# Patient Record
Sex: Female | Born: 1965 | Race: White | Hispanic: Yes | Marital: Married
Health system: Southern US, Community
[De-identification: ages and names within clinical notes are randomized; demographics above are authoritative.]

---

## 2006-09-30 ENCOUNTER — Inpatient Hospital Stay (HOSPITAL_COMMUNITY): Admission: AD | Admit: 2006-09-30 | Discharge: 2006-10-02 | Payer: Self-pay | Admitting: Obstetrics and Gynecology

## 2010-09-22 ENCOUNTER — Other Ambulatory Visit (HOSPITAL_COMMUNITY): Payer: Self-pay | Admitting: *Deleted

## 2010-09-22 DIAGNOSIS — Z139 Encounter for screening, unspecified: Secondary | ICD-10-CM

## 2010-10-03 ENCOUNTER — Ambulatory Visit (HOSPITAL_COMMUNITY)
Admission: RE | Admit: 2010-10-03 | Discharge: 2010-10-03 | Disposition: A | Discharge status: Home / Self Care | Payer: Self-pay | Source: Ambulatory Visit | Attending: Family Medicine | Admitting: Family Medicine

## 2010-10-03 DIAGNOSIS — Z1231 Encounter for screening mammogram for malignant neoplasm of breast: Secondary | ICD-10-CM | POA: Insufficient documentation

## 2010-10-03 DIAGNOSIS — Z139 Encounter for screening, unspecified: Secondary | ICD-10-CM

## 2010-10-07 ENCOUNTER — Other Ambulatory Visit: Payer: Self-pay | Admitting: *Deleted

## 2010-10-07 DIAGNOSIS — R928 Other abnormal and inconclusive findings on diagnostic imaging of breast: Secondary | ICD-10-CM

## 2010-10-19 ENCOUNTER — Other Ambulatory Visit: Payer: Self-pay | Admitting: *Deleted

## 2010-10-19 ENCOUNTER — Ambulatory Visit (HOSPITAL_COMMUNITY)
Admission: RE | Admit: 2010-10-19 | Discharge: 2010-10-19 | Disposition: A | Discharge status: Home / Self Care | Payer: Self-pay | Source: Ambulatory Visit | Attending: Family Medicine | Admitting: Family Medicine

## 2010-10-19 ENCOUNTER — Ambulatory Visit (HOSPITAL_COMMUNITY)
Admission: RE | Admit: 2010-10-19 | Discharge: 2010-10-19 | Disposition: A | Discharge status: Home / Self Care | Payer: Self-pay | Source: Ambulatory Visit | Attending: *Deleted | Admitting: *Deleted

## 2010-10-19 DIAGNOSIS — R928 Other abnormal and inconclusive findings on diagnostic imaging of breast: Secondary | ICD-10-CM

## 2010-10-19 DIAGNOSIS — N6009 Solitary cyst of unspecified breast: Secondary | ICD-10-CM | POA: Insufficient documentation

## 2012-07-25 IMAGING — US US BREAST*R*
1 series · 9 of 9 positions shown · non-contrast
Comparison: 10/03/2010 baseline screening mammogram.

CLINICAL DATA: Recall from screening mammography.

DIGITAL DIAGNOSTIC RIGHT BREAST MAMMOGRAM  AND RIGHT BREAST
ULTRASOUND:

[Series 1: us breast*right* · 0.08mm/px · 9 of 9 slices shown]
[im 1/9]
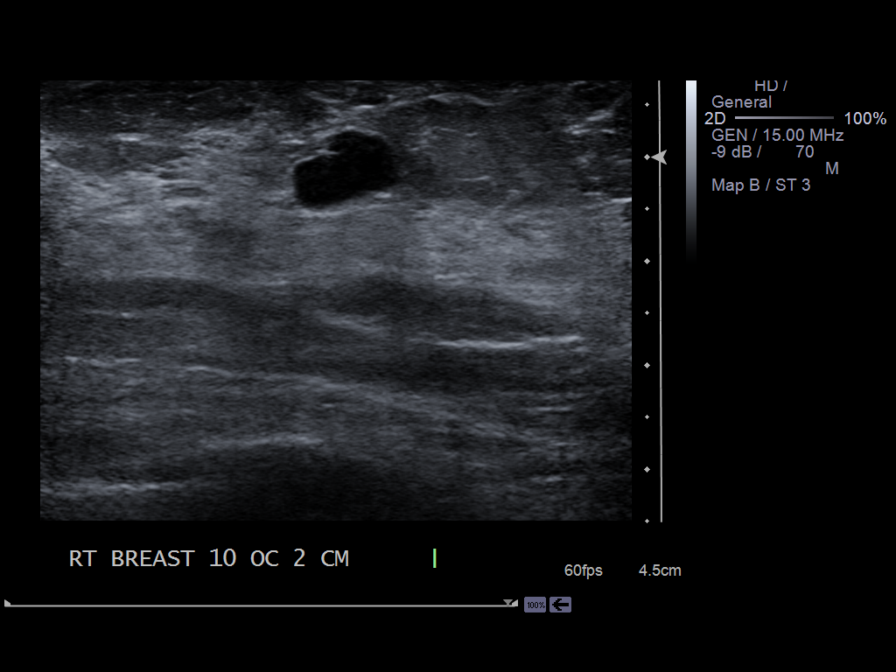
[im 2/9]
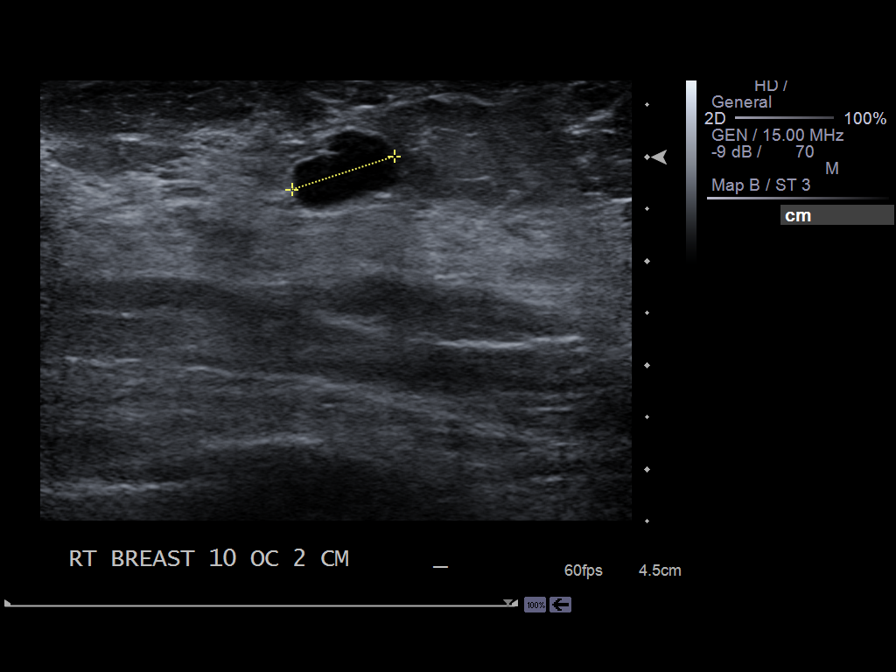
[im 3/9]
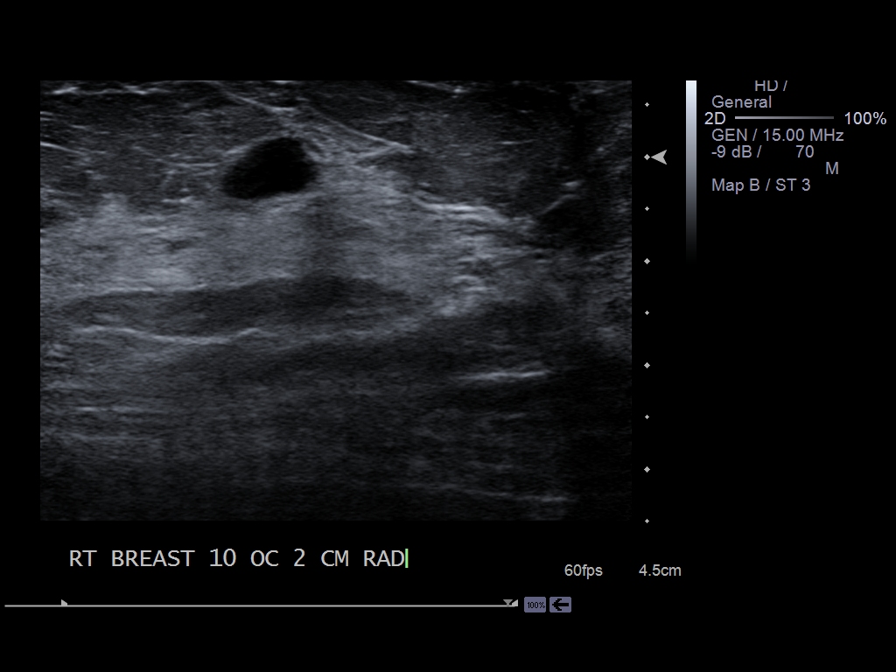
[im 4/9]
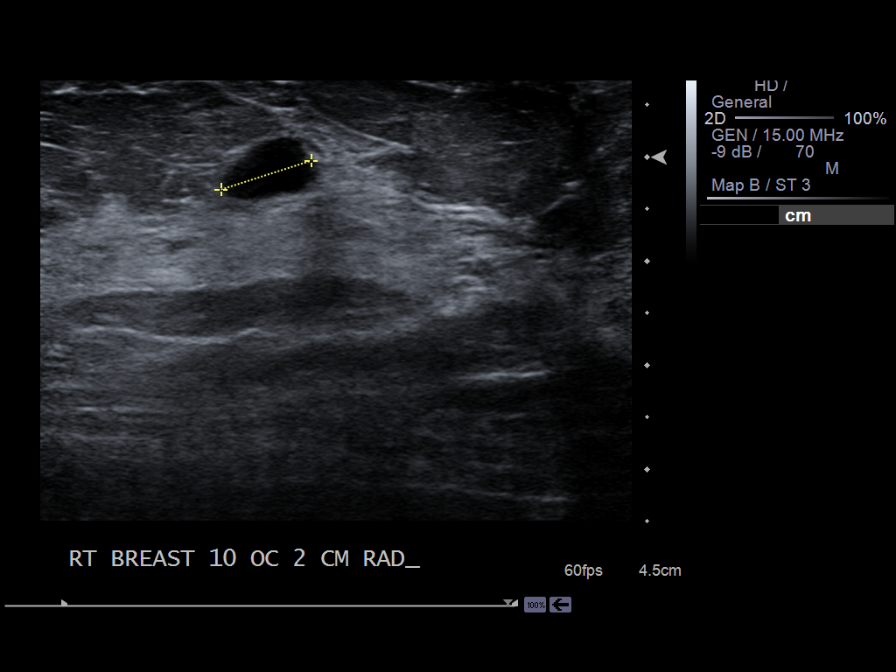
[im 5/9]
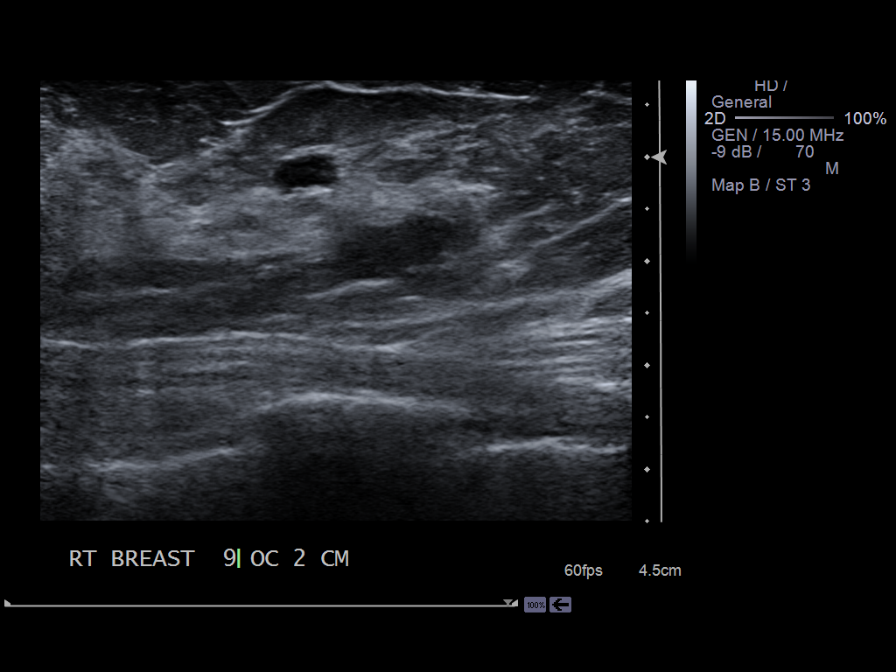
[im 6/9]
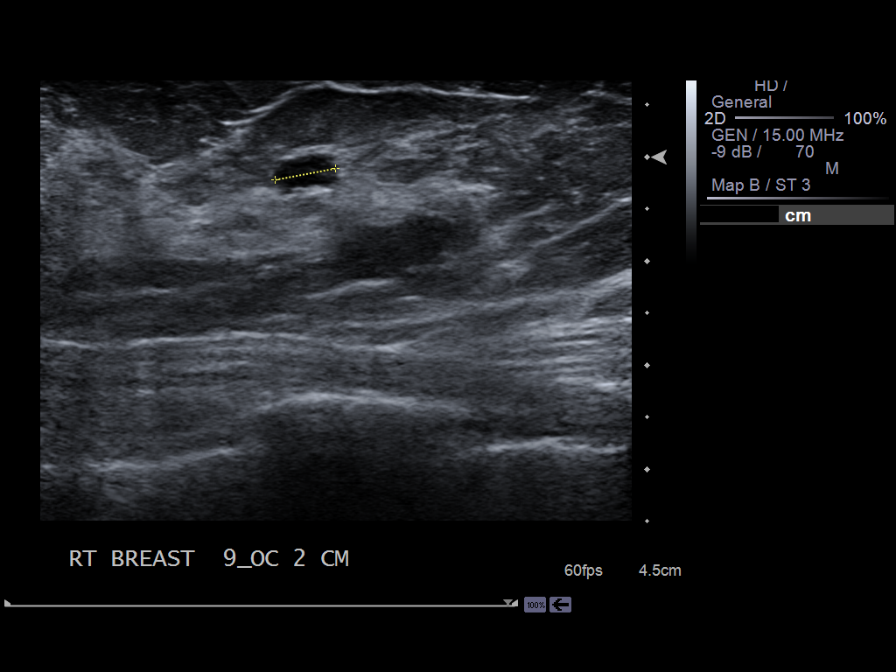
[im 7/9]
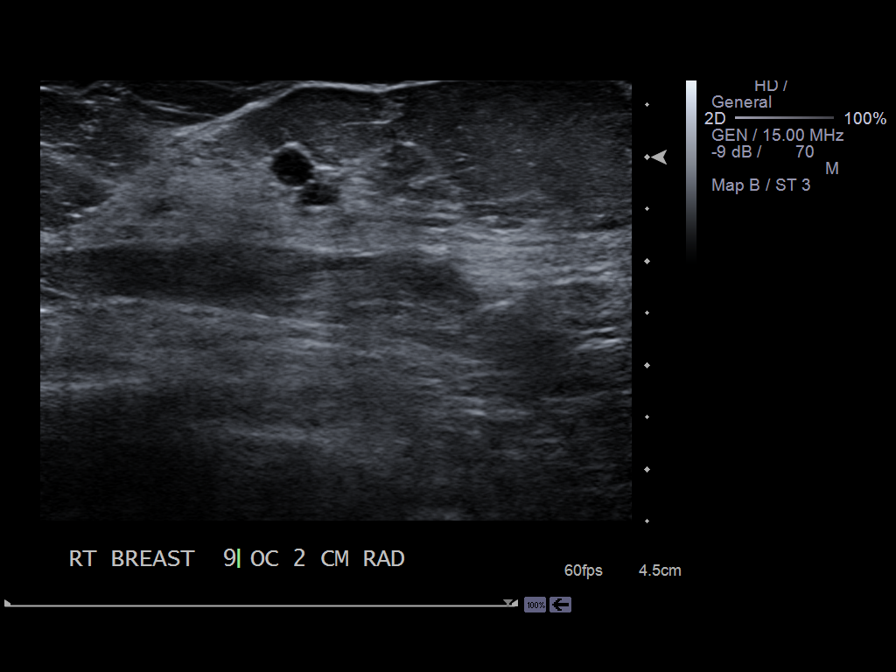
[im 8/9]
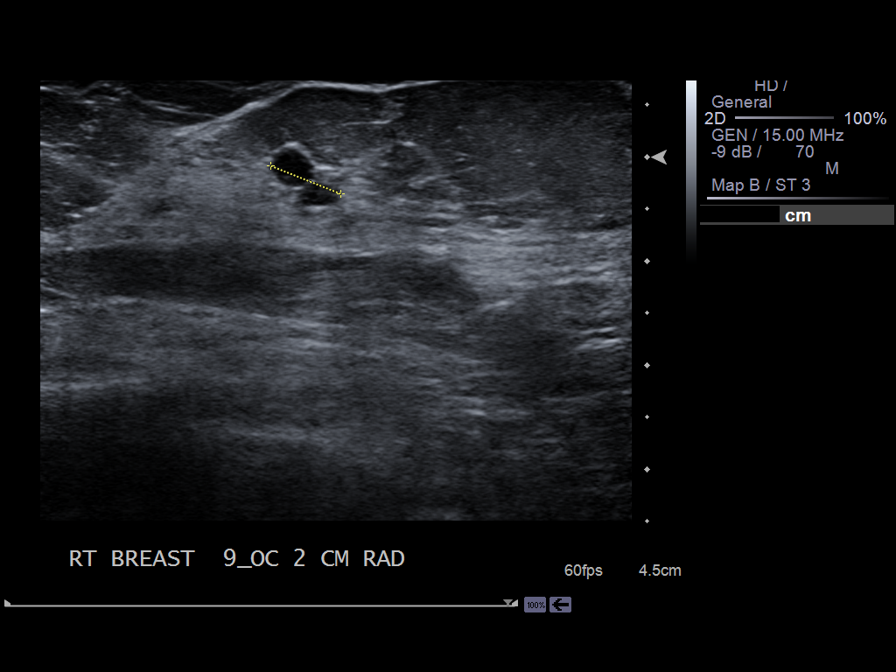
[im 9/9]
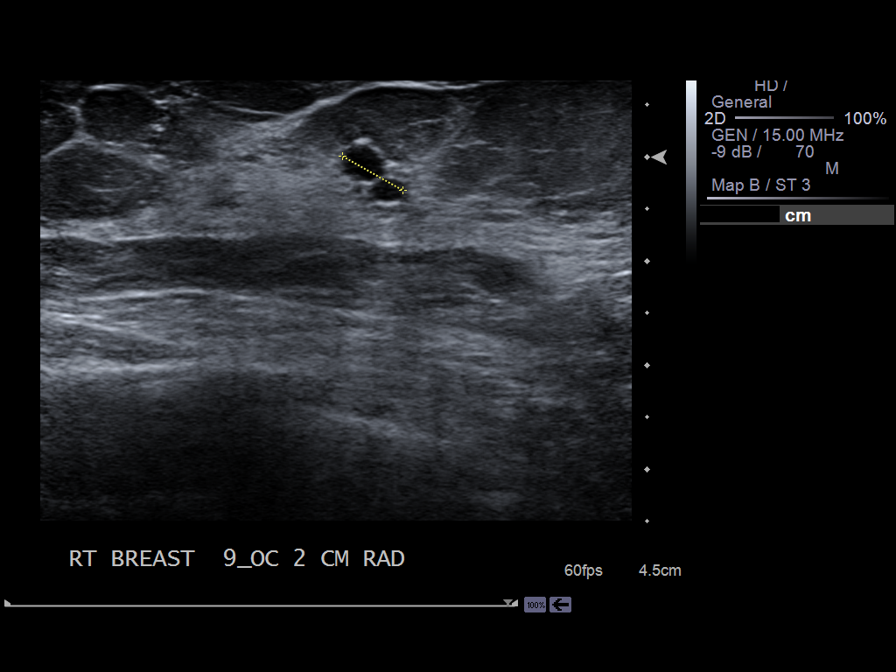

[9 of 9 positions shown; findings below may reference images not displayed]

FINDINGS: Spot compression views of the right breast demonstrate
two oval circumscribed nodules to be located laterally within the
right breast.  There is no distortion or worrisome calcification.

On physical exam, there is no discrete palpable abnormality.

Ultrasound is performed, showing a simple cyst located within the
right breast at 10 o'clock position 2 cm from the nipple measuring
1 cm size.  There is a second cluster of simple cysts located
within the right breast at 9 o'clock position 2 cm from the nipple
which in aggregate measure 7 mm in diameter.  There is no mass,
distortion, or worrisome shadowing.
IMPRESSION: Simple cysts located within the right breast at the 9 o'clock and
10 o'clock position corresponding to the mammographic abnormalities
noted on the screening mammogram.  No findings worrisome for
malignancy.  Recommend screening mammography in 1 year.

BI-RADS CATEGORY 2:  Benign finding(s).

## 2019-10-22 ENCOUNTER — Other Ambulatory Visit: Payer: Self-pay

## 2019-10-22 ENCOUNTER — Ambulatory Visit: Payer: Self-pay | Attending: Internal Medicine

## 2019-10-22 DIAGNOSIS — Z20822 Contact with and (suspected) exposure to covid-19: Secondary | ICD-10-CM | POA: Insufficient documentation

## 2019-10-23 ENCOUNTER — Telehealth: Payer: Self-pay | Admitting: General Practice

## 2019-10-23 LAB — SARS-COV-2, NAA 2 DAY TAT

## 2019-10-23 LAB — NOVEL CORONAVIRUS, NAA: SARS-CoV-2, NAA: NOT DETECTED

## 2019-10-23 NOTE — Telephone Encounter (Signed)
Negative COVID results given. Patient results "NOT Detected." Caller expressed understanding. ° °
